# Patient Record
Sex: Female | Born: 1970 | Race: White | Hispanic: No | Marital: Married | Smoking: Never smoker
Health system: Southern US, Community
[De-identification: ages and names within clinical notes are randomized; demographics above are authoritative.]

## PROBLEM LIST (undated history)

## (undated) DIAGNOSIS — E119 Type 2 diabetes mellitus without complications: Secondary | ICD-10-CM

## (undated) DIAGNOSIS — E7800 Pure hypercholesterolemia, unspecified: Secondary | ICD-10-CM

---

## 2014-05-15 ENCOUNTER — Emergency Department (HOSPITAL_COMMUNITY)
Admission: EM | Admit: 2014-05-15 | Discharge: 2014-05-15 | Disposition: A | Discharge status: Home / Self Care | Payer: 59 | Attending: Emergency Medicine | Admitting: Emergency Medicine

## 2014-05-15 ENCOUNTER — Emergency Department (HOSPITAL_COMMUNITY): Payer: 59

## 2014-05-15 ENCOUNTER — Encounter (HOSPITAL_COMMUNITY): Payer: Self-pay | Admitting: Emergency Medicine

## 2014-05-15 DIAGNOSIS — Z3202 Encounter for pregnancy test, result negative: Secondary | ICD-10-CM | POA: Insufficient documentation

## 2014-05-15 DIAGNOSIS — Y9389 Activity, other specified: Secondary | ICD-10-CM | POA: Diagnosis not present

## 2014-05-15 DIAGNOSIS — Y92410 Unspecified street and highway as the place of occurrence of the external cause: Secondary | ICD-10-CM | POA: Insufficient documentation

## 2014-05-15 DIAGNOSIS — S29092A Other injury of muscle and tendon of back wall of thorax, initial encounter: Secondary | ICD-10-CM | POA: Diagnosis not present

## 2014-05-15 DIAGNOSIS — S80212A Abrasion, left knee, initial encounter: Secondary | ICD-10-CM | POA: Diagnosis not present

## 2014-05-15 DIAGNOSIS — Y998 Other external cause status: Secondary | ICD-10-CM | POA: Diagnosis not present

## 2014-05-15 DIAGNOSIS — S299XXA Unspecified injury of thorax, initial encounter: Secondary | ICD-10-CM | POA: Diagnosis present

## 2014-05-15 DIAGNOSIS — S20212A Contusion of left front wall of thorax, initial encounter: Secondary | ICD-10-CM | POA: Diagnosis not present

## 2014-05-15 DIAGNOSIS — R739 Hyperglycemia, unspecified: Secondary | ICD-10-CM

## 2014-05-15 DIAGNOSIS — E1165 Type 2 diabetes mellitus with hyperglycemia: Secondary | ICD-10-CM | POA: Diagnosis not present

## 2014-05-15 HISTORY — DX: Pure hypercholesterolemia, unspecified: E78.00

## 2014-05-15 HISTORY — DX: Type 2 diabetes mellitus without complications: E11.9

## 2014-05-15 LAB — I-STAT CHEM 8, ED
BUN: 7 mg/dL (ref 6–20)
Calcium, Ion: 1.15 mmol/L (ref 1.12–1.23)
Chloride: 99 mmol/L — ABNORMAL LOW (ref 101–111)
Creatinine, Ser: 0.5 mg/dL (ref 0.44–1.00)
Glucose, Bld: 414 mg/dL — ABNORMAL HIGH (ref 70–99)
HCT: 42 % (ref 36.0–46.0)
Hemoglobin: 14.3 g/dL (ref 12.0–15.0)
Potassium: 5 mmol/L (ref 3.5–5.1)
Sodium: 136 mmol/L (ref 135–145)
TCO2: 22 mmol/L (ref 0–100)

## 2014-05-15 LAB — BASIC METABOLIC PANEL
Anion gap: 8 (ref 5–15)
BUN: 6 mg/dL (ref 6–20)
CO2: 24 mmol/L (ref 22–32)
Calcium: 8.3 mg/dL — ABNORMAL LOW (ref 8.9–10.3)
Chloride: 103 mmol/L (ref 101–111)
Creatinine, Ser: 0.5 mg/dL (ref 0.44–1.00)
GFR calc Af Amer: >60 mL/min (ref >60)
GFR calc non Af Amer: >60 mL/min (ref >60)
Glucose, Bld: 304 mg/dL — ABNORMAL HIGH (ref 70–99)
Potassium: 3.6 mmol/L (ref 3.5–5.1)
Sodium: 135 mmol/L (ref 135–145)

## 2014-05-15 LAB — URINE MICROSCOPIC-ADD ON

## 2014-05-15 LAB — URINALYSIS, ROUTINE W REFLEX MICROSCOPIC
Bilirubin Urine: NEGATIVE
Glucose, UA: >1000 mg/dL — AB
Ketones, ur: NEGATIVE mg/dL
Leukocytes, UA: NEGATIVE
Nitrite: NEGATIVE
Protein, ur: NEGATIVE mg/dL
Specific Gravity, Urine: 1.038 — ABNORMAL HIGH (ref 1.005–1.030)
Urobilinogen, UA: 0.2 mg/dL (ref 0.0–1.0)
pH: 5.5 (ref 5.0–8.0)

## 2014-05-15 LAB — PREGNANCY, URINE: Preg Test, Ur: NEGATIVE

## 2014-05-15 LAB — CBG MONITORING, ED
Glucose-Capillary: 321 mg/dL — ABNORMAL HIGH (ref 70–99)
Glucose-Capillary: 245 mg/dL — ABNORMAL HIGH (ref 70–99)

## 2014-05-15 MED ORDER — SODIUM CHLORIDE 0.9 % IV BOLUS (SEPSIS)
1000.0000 mL | Freq: Once | INTRAVENOUS | Status: AC
  Administered 2014-05-15: 1000 mL via INTRAVENOUS

## 2014-05-15 MED ORDER — IOHEXOL 300 MG/ML  SOLN
100.0000 mL | Freq: Once | INTRAMUSCULAR | Status: AC | PRN
  Administered 2014-05-15: 100 mL via INTRAVENOUS

## 2014-05-15 MED ORDER — HYDROCODONE-ACETAMINOPHEN 5-325 MG PO TABS: 1.0000 | ORAL_TABLET | ORAL | Status: AC | PRN

## 2014-05-15 MED ORDER — NAPROXEN 500 MG PO TABS: 500.0000 mg | ORAL_TABLET | Freq: Two times a day (BID) | ORAL | Status: DC

## 2014-05-15 NOTE — Discharge Instructions (Signed)
??  ng d?p (Contusion) ??ng d?p l v?t thm tm su. ??ng d?p l h?u qu? c?a ch?n th??ng gy ch?y mu d??i da. ??ng d?p c th? chuy?n thnh mu xanh, tm ho?c vng. Ch?n th??ng nh? s? ?? l?i v?t ??ng d?p khng ?au, nh?ng nh?ng ??ng d?p nghim tr?ng h?n c th? gy ?au ??n v s?ng m?t vi tu?n.  NGUYN NHN  ??ng d?p th??ng do m?t c ?nh, ch?n th??ng ho?c l?c tc ??ng tr?c ti?p ln m?t vng c?a c? th? gy ra. TRI?U CH?NG   S?ng v t?y ?? vng b? th??ng.  Thm tm vng b? th??ng.  Nh?y c?m ?au v ?au nh?c vng b? th??ng.  ?au. CH?N ?ON  Ch?n ?on c th? ???c ??a ra b?ng cch ki?m tra ti?n s? v khm th?c th?. C th? c?n ch?p X-quang, CT ho?c MRI ?? xc ??nh xem c b?t k? ch?n th??ng no lin quan, ch?ng h?n nh? gy x??ng. ?I?U TR?  ?i?u tr? c? th? s? ph? thu?c vo vng c? th? b? th??ng. Ni chung, bi?n php ?i?u tr? ??ng d?p t?t nh?t l ngh? ng?i, ch??m ?, nng cao v ch??m l?nh vng b? th??ng. Thu?c khng c?n k ??n c?ng c th? ???c khuyn dng ?? ki?m sot c?n ?au. H?i chuyn gia ch?m sc s?c kh?e cch ?i?u tr? t?t nh?t cho ch? ??ng d?p c?a qu v?. H??NG D?N CH?M SC T?I NH   Ch??m ? l?nh ln vng b? th??ng.  Cho ? l?nh vo ti nh?a.  ?? kh?n t?m vo gi?a da v ti.  ?? ? l?nh trong kho?ng 15-20 pht, 3-4 l?n m?i ngy, ho?c theo ch? d?n c?a chuyn gia ch?m sc s?c kh?e c?a qu v?.  Ch? s? d?ng thu?c khng c?n k ??n ho?c thu?c c?n k ??n ?? gi?m ?au, gi?m c?m gic kh ch?u ho?c h? s?t theo ch? d?n c?a chuyn gia ch?m sc s?c kh?e c?a qu v?. Chuyn gia ch?m sc s?c kh?e c th? khuyn qu v? trnh s? d?ng cc thu?c ch?ng vim (aspirin, ibuprofen v naproxen) trong 48 gi? v nh?ng thu?c ny c th? lm thm tm t?ng ln.  ?? vng b? th??ng ngh? ng?i.  N?u c th?, hy nng cao vng b? th??ng ?? gi?m s?ng. NGAY L?P T?C ?I KHM N?U:   Qu v? b? thm tm ho?c s?ng t?ng ln.  Qu v? b? ?au ngy cng nhi?u.  S?ng hay ?au nh?c khng thuyn gi?m sau khi dng thu?c. ??M B?O QU V?:     Hi?u r cc h??ng d?n ny.  S? theo di tnh tr?ng c?a mnh.  S? yu c?u tr? gip ngay l?p t?c n?u qu v? c?m th?y khng kh?e ho?c th?y tr?m tr?ng h?n. Document Released: 10/05/2004 Document Revised: 12/31/2012 [employer] Patient Information 2015 [employer]. This information is not intended to replace advice given to you by your health care provider. Make sure you discuss any questions you have with your health care provider.  

## 2014-05-15 NOTE — ED Notes (Signed)
Patient transported to X-ray 
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
## 2014-05-15 NOTE — ED Notes (Signed)
Pt ambulated to the bathroom. Pt tolerated ambulation well .

## 2014-06-15 ENCOUNTER — Other Ambulatory Visit: Payer: Self-pay | Admitting: Internal Medicine

## 2014-06-15 DIAGNOSIS — Z1231 Encounter for screening mammogram for malignant neoplasm of breast: Secondary | ICD-10-CM

## 2014-06-29 ENCOUNTER — Ambulatory Visit: Payer: 59

## 2014-07-06 ENCOUNTER — Other Ambulatory Visit: Payer: Self-pay

## 2014-07-06 ENCOUNTER — Other Ambulatory Visit (HOSPITAL_COMMUNITY): Payer: Self-pay | Admitting: Internal Medicine

## 2014-07-06 ENCOUNTER — Ambulatory Visit
Admission: RE | Admit: 2014-07-06 | Discharge: 2014-07-06 | Disposition: A | Discharge status: Home / Self Care | Payer: 59 | Source: Ambulatory Visit | Attending: Internal Medicine | Admitting: Internal Medicine

## 2014-07-06 ENCOUNTER — Ambulatory Visit (HOSPITAL_COMMUNITY): Payer: 59 | Attending: Cardiovascular Disease

## 2014-07-06 ENCOUNTER — Encounter (HOSPITAL_COMMUNITY): Payer: Self-pay | Admitting: Radiology

## 2014-07-06 DIAGNOSIS — Q211 Atrial septal defect: Secondary | ICD-10-CM | POA: Insufficient documentation

## 2014-07-06 DIAGNOSIS — Z1231 Encounter for screening mammogram for malignant neoplasm of breast: Secondary | ICD-10-CM

## 2014-07-06 DIAGNOSIS — I517 Cardiomegaly: Secondary | ICD-10-CM | POA: Diagnosis not present

## 2014-07-06 DIAGNOSIS — R011 Cardiac murmur, unspecified: Secondary | ICD-10-CM

## 2014-07-14 ENCOUNTER — Telehealth: Payer: Self-pay | Admitting: Internal Medicine

## 2014-07-14 NOTE — Telephone Encounter (Signed)
[facility] for appointment on 09/07/14 with Dr Rennis GoldenHilty.  [facility] ([facility] for Dr Blanchie DessertHilty's schedule on 09/07/14. lp

## 2014-07-24 ENCOUNTER — Telehealth: Payer: Self-pay | Admitting: Internal Medicine

## 2014-07-24 ENCOUNTER — Encounter: Payer: Self-pay | Admitting: Internal Medicine

## 2014-07-27 NOTE — Telephone Encounter (Signed)
Close encounter 

## 2014-09-07 ENCOUNTER — Ambulatory Visit: Payer: 59 | Admitting: Internal Medicine

## 2014-09-16 ENCOUNTER — Telehealth: Payer: Self-pay | Admitting: Internal Medicine

## 2014-09-16 NOTE — Telephone Encounter (Signed)
Received records from [facility] for appointment on 09/29/14 with Dr Rennis Golden.  Records given to [facility] (medical recors) for Dr Blanchie Dessert schedule on 09/29/14. lp

## 2014-09-21 ENCOUNTER — Other Ambulatory Visit: Payer: Self-pay | Admitting: Obstetrics & Gynecology

## 2014-09-21 ENCOUNTER — Other Ambulatory Visit (HOSPITAL_COMMUNITY)
Admission: RE | Admit: 2014-09-21 | Discharge: 2014-09-21 | Disposition: A | Discharge status: Home / Self Care | Payer: 59 | Source: Ambulatory Visit | Attending: Obstetrics & Gynecology | Admitting: Obstetrics & Gynecology

## 2014-09-21 DIAGNOSIS — Z01419 Encounter for gynecological examination (general) (routine) without abnormal findings: Secondary | ICD-10-CM | POA: Insufficient documentation

## 2014-09-21 DIAGNOSIS — Z1151 Encounter for screening for human papillomavirus (HPV): Secondary | ICD-10-CM | POA: Insufficient documentation

## 2014-09-23 LAB — CYTOLOGY - PAP

## 2014-09-29 ENCOUNTER — Ambulatory Visit: Payer: 59 | Admitting: Internal Medicine

## 2015-03-22 ENCOUNTER — Emergency Department (HOSPITAL_COMMUNITY)
Admission: EM | Admit: 2015-03-22 | Discharge: 2015-03-23 | Disposition: A | Discharge status: Home / Self Care | Payer: BLUE CROSS/BLUE SHIELD | Attending: Emergency Medicine | Admitting: Emergency Medicine

## 2015-03-22 ENCOUNTER — Encounter (HOSPITAL_COMMUNITY): Payer: Self-pay | Admitting: Family Medicine

## 2015-03-22 DIAGNOSIS — E119 Type 2 diabetes mellitus without complications: Secondary | ICD-10-CM | POA: Diagnosis not present

## 2015-03-22 DIAGNOSIS — B379 Candidiasis, unspecified: Secondary | ICD-10-CM | POA: Diagnosis not present

## 2015-03-22 DIAGNOSIS — Z791 Long term (current) use of non-steroidal anti-inflammatories (NSAID): Secondary | ICD-10-CM | POA: Insufficient documentation

## 2015-03-22 DIAGNOSIS — Z7984 Long term (current) use of oral hypoglycemic drugs: Secondary | ICD-10-CM | POA: Insufficient documentation

## 2015-03-22 DIAGNOSIS — Z79899 Other long term (current) drug therapy: Secondary | ICD-10-CM | POA: Insufficient documentation

## 2015-03-22 DIAGNOSIS — R739 Hyperglycemia, unspecified: Secondary | ICD-10-CM

## 2015-03-22 DIAGNOSIS — R7989 Other specified abnormal findings of blood chemistry: Secondary | ICD-10-CM | POA: Diagnosis present

## 2015-03-22 LAB — URINE MICROSCOPIC-ADD ON
Bacteria, UA: NONE SEEN
RBC / HPF: NONE SEEN RBC/hpf (ref 0–5)

## 2015-03-22 LAB — URINALYSIS, ROUTINE W REFLEX MICROSCOPIC
Bilirubin Urine: NEGATIVE
Glucose, UA: >1000 mg/dL — AB
Hgb urine dipstick: NEGATIVE
Ketones, ur: NEGATIVE mg/dL
Leukocytes, UA: NEGATIVE
Nitrite: NEGATIVE
Protein, ur: NEGATIVE mg/dL
Specific Gravity, Urine: 1.036 — ABNORMAL HIGH (ref 1.005–1.030)
pH: 6 (ref 5.0–8.0)

## 2015-03-22 LAB — CBC
HCT: 38.7 % (ref 36.0–46.0)
Hemoglobin: 13.1 g/dL (ref 12.0–15.0)
MCH: 25.8 pg — ABNORMAL LOW (ref 26.0–34.0)
MCHC: 33.9 g/dL (ref 30.0–36.0)
MCV: 76.2 fL — ABNORMAL LOW (ref 78.0–100.0)
Platelets: 279 10*3/uL (ref 150–400)
RBC: 5.08 MIL/uL (ref 3.87–5.11)
RDW: 12.6 % (ref 11.5–15.5)
WBC: 8.7 10*3/uL (ref 4.0–10.5)

## 2015-03-22 LAB — BASIC METABOLIC PANEL
Anion gap: 15 (ref 5–15)
BUN: 13 mg/dL (ref 6–20)
CO2: 22 mmol/L (ref 22–32)
Calcium: 9.7 mg/dL (ref 8.9–10.3)
Chloride: 96 mmol/L — ABNORMAL LOW (ref 101–111)
Creatinine, Ser: 0.92 mg/dL (ref 0.44–1.00)
GFR calc Af Amer: >60 mL/min (ref >60)
GFR calc non Af Amer: >60 mL/min (ref >60)
Glucose, Bld: 525 mg/dL — ABNORMAL HIGH (ref 65–99)
Potassium: 4.9 mmol/L (ref 3.5–5.1)
Sodium: 133 mmol/L — ABNORMAL LOW (ref 135–145)

## 2015-03-22 LAB — CBG MONITORING, ED: Glucose-Capillary: 321 mg/dL — ABNORMAL HIGH (ref 65–99)

## 2015-03-22 MED ORDER — SODIUM CHLORIDE 0.9 % IV BOLUS (SEPSIS)
1000.0000 mL | Freq: Once | INTRAVENOUS | Status: AC
  Administered 2015-03-22: 1000 mL via INTRAVENOUS

## 2015-03-22 NOTE — ED Notes (Addendum)
CBG 321. RN notified.

## 2015-03-22 NOTE — ED Notes (Signed)
Pt sts she was a her PCP today and had routine blood tests and told her blood sugar was high. Sent here. sts past few days she hasn't felt well.

## 2015-03-22 NOTE — ED Provider Notes (Signed)
CSN: [id number]     Arrival date & time 03/22/15  1802 History   First MD Initiated Contact with Patient 03/22/15 2243     Chief Complaint  Patient presents with  . Hypertension     (Consider location/radiation/quality/duration/timing/severity/associated sxs/prior Treatment) HPI  Patient speaks vietnamese and level V caveat due to use of language interpretor. Patient is a known diabetic and had been prescribed medications by her primary care doctor. She states that she is never gotten a medicine or checked her sugars in the past because she was confused about how to get the medication and check her glucose. She comes into today with prescriptions she picked up from her PCP to treat her elevated CBG today. He sent her to the ER for lab screening to ensure she is not in DKA because she reports not feeling well. She denies fevers, shortness of breath, dominant pain, vaginal discharge, vaginal bleeding, back pain, headache, weakness, vomiting, diarrhea, confusion, chest pain.  Past Medical History  Diagnosis Date  . Diabetes mellitus without complication (HCC)   . High cholesterol    History reviewed. No pertinent past surgical history. History reviewed. No pertinent family history. Social History  Substance Use Topics  . Smoking status: Never Smoker   . Smokeless tobacco: None  . Alcohol Use: No   OB History    No data available     Review of Systems  Review of Systems All other systems negative except as documented in the HPI. All pertinent positives and negatives as reviewed in the HPI.   Allergies  Review of patient's allergies indicates no known allergies.  Home Medications   Prior to Admission medications   Medication Sig Start Date End Date Taking? Authorizing Provider  fluconazole (DIFLUCAN) 200 MG tablet Take 1 tablet (200 mg total) by mouth daily. 03/23/15 03/30/15  Marlon Pel, PA-C  HYDROcodone-acetaminophen (NORCO/VICODIN) 5-325 MG per tablet Take 1 tablet by  mouth every 4 (four) hours as needed. 05/15/14   Glynn Octave, MD  metFORMIN (GLUCOPHAGE) 500 MG tablet Take 500 mg by mouth daily with breakfast.    Historical Provider, MD  naproxen (NAPROSYN) 500 MG tablet Take 1 tablet (500 mg total) by mouth 2 (two) times daily. 05/15/14   Glynn Octave, MD   BP 130/80 mmHg  Pulse 63  Temp(Src) 98.2 F (36.8 C) (Oral)  Resp 18  SpO2 100% Physical Exam  Constitutional: She appears well-developed and well-nourished. No distress.  HENT:  Head: Normocephalic and atraumatic.  Right Ear: Tympanic membrane and ear canal normal.  Left Ear: Tympanic membrane and ear canal normal.  Nose: Nose normal.  Mouth/Throat: Uvula is midline, oropharynx is clear and moist and mucous membranes are normal.  Eyes: Pupils are equal, round, and reactive to light.  Neck: Normal range of motion. Neck supple.  Cardiovascular: Normal rate and regular rhythm.   Pulmonary/Chest: Effort normal.  Abdominal: Soft.  No signs of abdominal distention  Musculoskeletal:  No LE swelling  Neurological: She is alert.  Acting at baseline  Skin: Skin is warm and dry. No rash noted.  Nursing note and vitals reviewed.   ED Course  Procedures (including critical care time) Labs Review Labs Reviewed  BASIC METABOLIC PANEL - Abnormal; Notable for the following:    Sodium 133 (*)    Chloride 96 (*)    Glucose, Bld 525 (*)    All other components within normal limits  CBC - Abnormal; Notable for the following:    MCV 76.2 (*)  MCH 25.8 (*)    All other components within normal limits  URINALYSIS, ROUTINE W REFLEX MICROSCOPIC (NOT AT [facility]) - Abnormal; Notable for the following:    Specific Gravity, Urine 1.036 (*)    Glucose, UA >1000 (*)    All other components within normal limits  URINE MICROSCOPIC-ADD ON - Abnormal; Notable for the following:    Squamous Epithelial / LPF 0-5 (*)    All other components within normal limits  CBG MONITORING, ED - Abnormal; Notable for the  following:    Glucose-Capillary 321 (*)    All other components within normal limits  CBG MONITORING, ED - Abnormal; Notable for the following:    Glucose-Capillary 238 (*)    All other components within normal limits  CBG MONITORING, ED    Imaging Review No results found. I have personally reviewed and evaluated these images and lab results as part of my medical decision-making.   EKG Interpretation None      MDM   Final diagnoses:  Yeast infection  Hyperglycemia    A shunt is well appearing, not in DKA.  Given a fluid bolus to help correct glucose initial CBG 525 --> 321 --> 238.  Her urinalysis shows some yeast cells, will treat with Diflucan, no vaginal complaints or abdominal pain. Dr. Roseanne RenoHassan has given her prescription for metformin and glyburide discussed these results together in a major patient understood how to take them and for how long. On the bottles I'm able to see that she has been given 11 refills.  Medications  fluconazole (DIFLUCAN) tablet 200 mg (not administered)  sodium chloride 0.9 % bolus 1,000 mL (0 mLs Intravenous Stopped 03/23/15 0047)     I feel the patient has had an appropriate workup for their chief complaint at this time and likelihood of emergent condition existing is low. Discussed s/sx that warrant return to the ED.  Filed Vitals:   03/23/15 0030 03/23/15 0045  BP: 133/78 130/80  Pulse: 64 63  Temp:    Resp:       Marlon Peliffany Kenitra Leventhal, PA-C 03/23/15 0115  Rolland PorterMark James, MD 03/31/15 [phone]

## 2015-03-22 NOTE — ED Notes (Signed)
TG, EDPA in to see pt.

## 2015-03-23 LAB — CBG MONITORING, ED: Glucose-Capillary: 238 mg/dL — ABNORMAL HIGH (ref 65–99)

## 2015-03-23 MED ORDER — FLUCONAZOLE 100 MG PO TABS
200.0000 mg | ORAL_TABLET | Freq: Once | ORAL | Status: AC
  Administered 2015-03-23: 200 mg via ORAL
  Filled 2015-03-23 (×2): qty 2

## 2015-03-23 MED ORDER — FLUCONAZOLE 200 MG PO TABS: 200.0000 mg | ORAL_TABLET | Freq: Every day | ORAL | Status: AC

## 2015-03-23 NOTE — Discharge Instructions (Signed)
Vim m ??o Do N?m Candida (Monilial Vaginitis) Vim m ??o l m?t tnh tr?ng s?ng, ?? v ?au (vim) c?a m ??o v m h?. B?nh vim m ??o do n?m candida khng ph?i l b?nh nhi?m trng ly qua ???ng tnh d?c.  NGUYN NHN Vim m ??o do n?m gy ra b?i n?m men (n?m candida) th??ng th?y trong m ??o. Trong nhi?m trng do n?m men, n?m candida pht tri?n qu m?c v? s? l??ng ??n ?? lm xo tr?n cn b?ng v? m?t ha h?c. TRI?U CH?NG  Huy?t tr?ng m ??o c m?ng d?y v tr?ng.  S?ng, ng?a, ?? t?y v kch ?ng m ??o v c th? c? cc mi c?a m ??o (m h?).  Ti?u ?au hay rt bu?t.  Giao h?p ?au. CH?N ?ON Nh?ng th? c th? gp ph?n gy ra vim m ??o do n?m candida l:  Cc giai ?o?n h?u mn kinh v tr??c khi l?p gia ?nh.  [place] k?.  Nhi?m trng.  M?t m?i, u? o?i hay c?ng th?ng, ??c bi?t l n?u ? t?ng b? vim m ??o do n?m candida tr??c ?y.  Ti?u ???ng. (?i?u tr? ?n ??nh s? gip h? th?p xc su?t m?c b?nh h?n.)  Dng cc vin thu?c ng?a [place].  M?c qu?n o b st.  S? d?ng s?a t?m nhi?u b?t, thu?c x?t v? sinh ph? n?, th?t r?a m ??o, ho?c b?ng v? sinh d?ng nht vo m ??o c t?m ch?t kh? mi.  ?ang dng m?t s? khng sinh (thu?c di?t vi trng).  Th?nh tho?ng c th? b? ti pht n?u b? ?m. ?I?U TR? Chuyn gia ch?m [place] s?c kh?e s? c?p thu?c cho b?nh nhn.  C m?t vi lo?i kem bi m ??o khng n?m candida v cc thu?c ??n ??t m ??o ??c hi?u dng ?? ?i?u tr? vim m ??o do n?m candida.  C?ng c th? dng kem khng n?m candida hay kem ch?a steroid ?? ?i?u tr? ng?a hay kch ?ng m h?. Nn c s? cho php c?a chuyn gia ch?m [place] s?c kh?e.  Bi ln m ??o dung d?ch xanh methylene c th? gip ch n?u kem khng n?m candida khng c tc d?ng.  ?n s?a chua c th? gip phng ng?a vim m ??o do n?m candida. H??NG D?N CH?M [place] T?I NH  U?ng t?t c? cc thu?c ? ???c k ??n.  Khng quan h? tnh d?c cho ??n khi hon t?t vi?c ?i?u tr? hay khi ???c chuyn gia ch?m [place] s?c kh?e h??ng d?n.  Ngm r?a  vng kn trong n??c ?m.  Khng ???c th?t r?a m ??o.  Khng dng b?ng v? sinh d?ng nht vo m ??o, ??c bi?t l nh?ng lo?i ??p n??c [place].  M?c cc lo?i qu?n lt v?i [place].  Trnh m?c qu?n ch?n v t?t li?n qu?n b ch?t.  Hy ni v?i b?n tnh v? vi?c b? nhi?m n?m. Nn ?i khm n?u c nh?ng tri?u ch?ng nh? pht ban d?ng nh? ho?c ng?a.  B?n tnh c?ng nn ???c ?i?u tr? lun n?u tnh tr?ng nhi?m trng kh ch?a d?t.  Th?c hnh tnh d?c an ton h?n - s? d?ng bao cao su.  M?t s? thu?c dng t?i m ??o c th? lm cho ch?t nh?a m? c?a bao cao su b? h?ng. Cc thu?c dng t?i m ??o gy h?i cho bao cao su l:  Kem thoa Cleocin.  Butoconazole (Femstat).  Vin ??t m ??o Terconazole (Terazol).  Miconazole (Monistat) (c th? mua m khng c?n k  toa). H?Y ?I KHM N?U:  Nhi?t ?? ?o ? mi?ng trn 38,9 C (102 F).  Tnh tr?ng nhi?m trng c?a b?n tr? nn t? h?n sau 2 ngy ?i?u tr?.  Tnh tr?ng nhi?m trng c?a b?n khng kh h?n sau 3 ngy ?i?u tr?Marland Kitchen.  B?n b? m?n n??c trong ho?c xung quanh m ??o.  B?n b? ch?y mu m ??o, v n khng ph?i l k? kinh nguy?t c?a b?n.  B?n b? ?au khi ?i ti?u.  B?n b? cc v?n ?? v? ???ng ru?t.  B?n b? ?au khi giao h?p.   Thng tin ny khng nh?m m?c ?ch thay th? cho l?i khuyn m chuyn gia ch?m [place] s?c kh?e ni v?i qu v?. Hy b?o ??m qu v? ph?i th?o lu?n b?t k? v?n ?? g m qu v? c v?i chuyn gia ch?m [place] s?c kh?e c?a qu v?.   Document Released: 12/26/2004 Document Revised: 08/28/2012 Elsevier Interactive Patient Education 2016 Elsevier Inc. Hyperglycemia High blood sugar (hyperglycemia) means that the level of sugar in your blood is higher than it should be. Signs of high blood sugar include:  Feeling thirsty.  Frequent peeing (urinating).  Feeling tired or sleepy.  Dry mouth.  Vision changes.  Feeling weak.  Feeling hungry but losing weight.  Numbness and tingling in your hands or feet.  Headache. When you ignore these signs, your  blood sugar may keep going up. These problems may get worse, and other problems may begin. HOME CARE  Check your blood sugars as told by your doctor. Write down the numbers with the date and time.  Take the right amount of insulin or diabetes pills at the right time. Write down the dose with date and time.  Refill your insulin or diabetes pills before running out.  Watch what you eat. Follow your meal plan.  Drink liquids without sugar, such as water. Check with your doctor if you have kidney or heart disease.  Follow your doctor's orders for exercise. Exercise at the same time of day.  Keep your doctor's appointments. GET HELP RIGHT AWAY IF:   You have trouble thinking or are confused.  You have fast breathing with fruity smelling breath.  You pass out (faint).  You have 2 to 3 days of high blood sugars and you do not know why.  You have chest pain.  You are feeling sick to your stomach (nauseous) or throwing up (vomiting).  You have sudden vision changes. MAKE SURE YOU:   Understand these instructions.  Will watch your condition.  Will get help right away if you are not doing well or get worse.   This information is not intended to replace advice given to you by your health care provider. Make sure you discuss any questions you have with your health care provider.   Document Released: 10/23/2008 Document Revised: 01/16/2014 Document Reviewed: 09/01/2014 Elsevier Interactive Patient Education [employer] Elsevier Inc.

## 2015-10-04 ENCOUNTER — Other Ambulatory Visit: Payer: Self-pay | Admitting: Obstetrics & Gynecology

## 2015-10-04 DIAGNOSIS — Z1231 Encounter for screening mammogram for malignant neoplasm of breast: Secondary | ICD-10-CM

## 2015-10-19 ENCOUNTER — Ambulatory Visit: Payer: BLUE CROSS/BLUE SHIELD

## 2015-10-25 ENCOUNTER — Ambulatory Visit
Admission: RE | Admit: 2015-10-25 | Discharge: 2015-10-25 | Disposition: A | Discharge status: Home / Self Care | Payer: BLUE CROSS/BLUE SHIELD | Source: Ambulatory Visit | Attending: Obstetrics & Gynecology | Admitting: Obstetrics & Gynecology

## 2015-10-25 DIAGNOSIS — Z1231 Encounter for screening mammogram for malignant neoplasm of breast: Secondary | ICD-10-CM

## 2016-03-06 ENCOUNTER — Encounter: Payer: BLUE CROSS/BLUE SHIELD | Attending: Internal Medicine | Admitting: Registered"

## 2016-03-06 DIAGNOSIS — E119 Type 2 diabetes mellitus without complications: Secondary | ICD-10-CM | POA: Diagnosis not present

## 2016-03-06 DIAGNOSIS — Z713 Dietary counseling and surveillance: Secondary | ICD-10-CM | POA: Diagnosis not present

## 2016-03-06 NOTE — Progress Notes (Signed)
Diabetes Self-Management Education  Visit Type: First/Initial  Appt. Start Time: 1100 Appt. End Time: 1230  03/06/2016  Ms. Angela Howell, identified by name and date of birth, is a 46 y.o. female with a diagnosis of Diabetes: Type 2.   Patient states through interpreter that she  avoids beef. Doesn't feel well after eating beef.  ASSESSMENT      Diabetes Self-Management Education - 03/06/16 1127      Visit Information   Visit Type First/Initial     Initial Visit   Diabetes Type Type 2   Are you currently following a meal plan? No   Are you taking your medications as prescribed? Yes   Date Diagnosed 2014     Health Coping   How would you rate your overall health? Good     Psychosocial Assessment   Patient Belief/Attitude about Diabetes Afraid   Special Needs Other (comment)  does not speak [place], needs instruction in [place]   How often do you need to have someone help you when you read instructions, pamphlets, or other written materials from your doctor or pharmacy? 5 - Always  speaks [place]     Complications   Last HgB A1C per patient/outside source 8.8 %  01/17/2016   Have you had a dilated eye exam in the past 12 months? No   Have you had a dental exam in the past 12 months? No   Are you checking your feet? No     Dietary Intake   Breakfast bread OR yogurt OR fruit; water   Snack (morning) none (no time)   Lunch rice, fish, salad   Snack (afternoon) no because of diagnosis   Dinner rice, fish, salad   Snack (evening) none   Beverage(s) water     Exercise   Exercise Type ADL's   How many days per week to you exercise? 0   How many minutes per day do you exercise? 0   Total minutes per week of exercise 0     Patient Education   Previous Diabetes Education No   Disease state  Definition of diabetes, type 1 and 2, and the diagnosis of diabetes   Nutrition management  Role of diet in the treatment of diabetes and the relationship between the three main  macronutrients and blood glucose level;Food label reading, portion sizes and measuring food.   Chronic complications Relationship between chronic complications and blood glucose control;Assessed and discussed foot care and prevention of foot problems     Individualized Goals (developed by patient)   Nutrition General guidelines for healthy choices and portions discussed     Outcomes   Expected Outcomes Demonstrated interest in learning. Expect positive outcomes   Future DMSE PRN   Program Status Completed      Individualized Plan for Diabetes Self-Management Training:   Learning Objective:  Patient will have a greater understanding of diabetes self-management. Patient education plan is to attend individual and/or group sessions per assessed needs and concerns.   Plan:   There are no Patient Instructions on file for this visit.  Expected Outcomes:  Demonstrated interest in learning. Expect positive outcomes  Education material provided: Living Well with Diabetes,(used interpreter to review digital copy of guide, did not provide hard copy), [place] nutrition facts label.  If problems or questions, patient to contact team via:  Phone and Email  Future DSME appointment: PRN

## 2020-03-22 ENCOUNTER — Ambulatory Visit
Admission: EM | Admit: 2020-03-22 | Discharge: 2020-03-22 | Disposition: A | Discharge status: Home / Self Care | Payer: Self-pay

## 2020-03-22 ENCOUNTER — Encounter: Payer: Self-pay | Admitting: Emergency Medicine

## 2020-03-22 ENCOUNTER — Ambulatory Visit (INDEPENDENT_AMBULATORY_CARE_PROVIDER_SITE_OTHER): Payer: Self-pay

## 2020-03-22 ENCOUNTER — Other Ambulatory Visit: Payer: Self-pay

## 2020-03-22 DIAGNOSIS — R0781 Pleurodynia: Secondary | ICD-10-CM

## 2020-03-22 DIAGNOSIS — S299XXA Unspecified injury of thorax, initial encounter: Secondary | ICD-10-CM

## 2020-03-22 MED ORDER — CYCLOBENZAPRINE HCL 5 MG PO TABS: 5.0000 mg | ORAL_TABLET | Freq: Two times a day (BID) | ORAL | 0 refills | Status: AC | PRN

## 2020-03-22 MED ORDER — NAPROXEN 500 MG PO TABS: 500.0000 mg | ORAL_TABLET | Freq: Two times a day (BID) | ORAL | 0 refills | Status: AC
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
## 2020-03-22 NOTE — Discharge Instructions (Signed)
No rib fractures Naprosyn twice daily for pain Flexeril at home/bedtime Alternate ice and heat to area Follow-up if not improving or worsening
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
